# Patient Record
Sex: Female | Born: 1985 | Hispanic: No | Marital: Married | Smoking: Never smoker
Health system: Southern US, Community
[De-identification: ages and names within clinical notes are randomized; demographics above are authoritative.]

## PROBLEM LIST (undated history)

## (undated) DIAGNOSIS — G43909 Migraine, unspecified, not intractable, without status migrainosus: Secondary | ICD-10-CM

## (undated) DIAGNOSIS — Z9889 Other specified postprocedural states: Secondary | ICD-10-CM

## (undated) HISTORY — PX: TUBAL LIGATION: SHX77

## (undated) HISTORY — DX: Migraine, unspecified, not intractable, without status migrainosus: G43.909

## (undated) HISTORY — PX: IUD REMOVAL: SHX5392

## (undated) HISTORY — DX: Other specified postprocedural states: Z98.890

---

## 2012-03-29 ENCOUNTER — Other Ambulatory Visit (HOSPITAL_COMMUNITY)
Admission: RE | Admit: 2012-03-29 | Discharge: 2012-03-29 | Disposition: A | Discharge status: Home / Self Care | Payer: BC Managed Care – PPO | Source: Ambulatory Visit | Attending: Obstetrics and Gynecology | Admitting: Obstetrics and Gynecology

## 2012-03-29 DIAGNOSIS — Z01419 Encounter for gynecological examination (general) (routine) without abnormal findings: Secondary | ICD-10-CM | POA: Insufficient documentation

## 2012-08-03 ENCOUNTER — Ambulatory Visit: Payer: BC Managed Care – PPO | Admitting: Family Medicine

## 2012-08-03 VITALS — BP 98/62 | HR 70 | Temp 99.0°F | Resp 16 | Ht 64.0 in | Wt 122.0 lb

## 2012-08-03 DIAGNOSIS — J020 Streptococcal pharyngitis: Secondary | ICD-10-CM

## 2012-08-03 MED ORDER — AMOXICILLIN 875 MG PO TABS: 875.0000 mg | ORAL_TABLET | Freq: Two times a day (BID) | ORAL | Status: DC

## 2012-08-03 NOTE — Patient Instructions (Addendum)
Strep Throat  Strep throat is an infection of the throat caused by a bacteria named Streptococcus pyogenes. Your caregiver may call the infection streptococcal "tonsillitis" or "pharyngitis" depending on whether there are signs of inflammation in the tonsils or back of the throat. Strep throat is most common in children aged 29 15 years during the cold months of the year, but it can occur in people of any age during any season. This infection is spread from person to person (contagious) through coughing, sneezing, or other close contact.  SYMPTOMS   · Fever or chills.  · Painful, swollen, red tonsils or throat.  · Pain or difficulty when swallowing.  · White or yellow spots on the tonsils or throat.  · Swollen, tender lymph nodes or "glands" of the neck or under the jaw.  · Red rash all over the body (rare).  DIAGNOSIS   Many different infections can cause the same symptoms. A test must be done to confirm the diagnosis so the right treatment can be given. A "rapid strep test" can help your caregiver make the diagnosis in a few minutes. If this test is not available, a light swab of the infected area can be used for a throat culture test. If a throat culture test is done, results are usually available in a day or two.  TREATMENT   Strep throat is treated with antibiotic medicine.  HOME CARE INSTRUCTIONS   · Gargle with 1 tsp of salt in 1 cup of warm water, 3 4 times per day or as needed for comfort.  · Family members who also have a sore throat or fever should be tested for strep throat and treated with antibiotics if they have the strep infection.  · Make sure everyone in your household washes their hands well.  · Do not share food, drinking cups, or personal items that could cause the infection to spread to others.  · You may need to eat a soft food diet until your sore throat gets better.  · Drink enough water and fluids to keep your urine clear or pale yellow. This will help prevent dehydration.  · Get plenty of  rest.  · Stay home from school, daycare, or work until you have been on antibiotics for 24 hours.  · Only take over-the-counter or prescription medicines for pain, discomfort, or fever as directed by your caregiver.  · If antibiotics are prescribed, take them as directed. Finish them even if you start to feel better.  SEEK MEDICAL CARE IF:   · The glands in your neck continue to enlarge.  · You develop a rash, cough, or earache.  · You cough up green, yellow-brown, or bloody sputum.  · You have pain or discomfort not controlled by medicines.  · Your problems seem to be getting worse rather than better.  SEEK IMMEDIATE MEDICAL CARE IF:   · You develop any new symptoms such as vomiting, severe headache, stiff or painful neck, chest pain, shortness of breath, or trouble swallowing.  · You develop severe throat pain, drooling, or changes in your voice.  · You develop swelling of the neck, or the skin on the neck becomes red and tender.  · You have a fever.  · You develop signs of dehydration, such as fatigue, dry mouth, and decreased urination.  · You become increasingly sleepy, or you cannot wake up completely.  Document Released: 01/17/2000 Document Revised: 01/06/2012 Document Reviewed: 03/20/2010  [employer]® Patient Information ©2014 [employer].

## 2012-08-03 NOTE — Progress Notes (Signed)
Subjective: 27 year old lady who has had a sore throat for couple days. She was feverish yesterday, up to 102 months when she was getting out of bed. She has had a sore throat, generalized achiness and malaise, and tender swollen glands in her neck. Not coughing and does not have rhinorrhea. She does not smoke. Her sister had a sore throat last weekend and they've been together some.  Objective: Well-developed well-nourished young lady who doesn't look like she feels were well. Her TMs are normal. Throat erythematous without exudate. Neck has moderately large anterior cervical nodes. Chest is clear to auscultation. Heart regular without murmurs.  Assessment: Pharyngitis, suspicious for strep  Plan: Strep screen. If rapid test is negative will do full culture.  Results for orders placed in visit on 08/03/12  POCT RAPID STREP A (OFFICE)      Result Value Range   Rapid Strep A Screen Positive (*) Negative

## 2012-09-02 ENCOUNTER — Emergency Department (HOSPITAL_COMMUNITY)
Admission: EM | Admit: 2012-09-02 | Discharge: 2012-09-02 | Disposition: A | Discharge status: Home / Self Care | Payer: BC Managed Care – PPO | Attending: Emergency Medicine | Admitting: Emergency Medicine

## 2012-09-02 ENCOUNTER — Encounter (HOSPITAL_COMMUNITY): Payer: Self-pay | Admitting: Emergency Medicine

## 2012-09-02 ENCOUNTER — Emergency Department (HOSPITAL_COMMUNITY): Payer: BC Managed Care – PPO

## 2012-09-02 DIAGNOSIS — B9789 Other viral agents as the cause of diseases classified elsewhere: Secondary | ICD-10-CM | POA: Insufficient documentation

## 2012-09-02 DIAGNOSIS — IMO0001 Reserved for inherently not codable concepts without codable children: Secondary | ICD-10-CM | POA: Insufficient documentation

## 2012-09-02 DIAGNOSIS — R5381 Other malaise: Secondary | ICD-10-CM | POA: Insufficient documentation

## 2012-09-02 DIAGNOSIS — R599 Enlarged lymph nodes, unspecified: Secondary | ICD-10-CM | POA: Insufficient documentation

## 2012-09-02 DIAGNOSIS — R5383 Other fatigue: Secondary | ICD-10-CM | POA: Insufficient documentation

## 2012-09-02 DIAGNOSIS — R0982 Postnasal drip: Secondary | ICD-10-CM | POA: Insufficient documentation

## 2012-09-02 DIAGNOSIS — R110 Nausea: Secondary | ICD-10-CM | POA: Insufficient documentation

## 2012-09-02 DIAGNOSIS — J029 Acute pharyngitis, unspecified: Secondary | ICD-10-CM | POA: Insufficient documentation

## 2012-09-02 DIAGNOSIS — B349 Viral infection, unspecified: Secondary | ICD-10-CM

## 2012-09-02 DIAGNOSIS — J3489 Other specified disorders of nose and nasal sinuses: Secondary | ICD-10-CM | POA: Insufficient documentation

## 2012-09-02 LAB — CBC WITH DIFFERENTIAL/PLATELET
Basophils Absolute: 0 10*3/uL (ref 0.0–0.1)
HCT: 37.2 % (ref 36.0–46.0)
Hemoglobin: 12.3 g/dL (ref 12.0–15.0)
Lymphocytes Relative: 12 % (ref 12–46)
Monocytes Absolute: 1.4 10*3/uL — ABNORMAL HIGH (ref 0.1–1.0)
Neutro Abs: 9 10*3/uL — ABNORMAL HIGH (ref 1.7–7.7)
RDW: 14.2 % (ref 11.5–15.5)
WBC: 11.8 10*3/uL — ABNORMAL HIGH (ref 4.0–10.5)

## 2012-09-02 LAB — URINALYSIS, ROUTINE W REFLEX MICROSCOPIC
Glucose, UA: NEGATIVE mg/dL
Leukocytes, UA: NEGATIVE
Protein, ur: 30 mg/dL — AB
pH: 6 (ref 5.0–8.0)

## 2012-09-02 LAB — URINE MICROSCOPIC-ADD ON

## 2012-09-02 MED ORDER — ONDANSETRON HCL 4 MG PO TABS: 4.0000 mg | ORAL_TABLET | Freq: Four times a day (QID) | ORAL | Status: DC

## 2012-09-02 MED ORDER — ONDANSETRON 4 MG PO TBDP
4.0000 mg | ORAL_TABLET | Freq: Once | ORAL | Status: AC
  Administered 2012-09-02: 4 mg via ORAL
  Filled 2012-09-02: qty 1

## 2012-09-02 NOTE — ED Notes (Signed)
Pt c/o fever, sore throat and congestion since Monday night. Pt father stated that she has had a cold off and on all summer. Pt went to [facility] Wednesday and received ABT shot and steroid shot and was also started on Zpack. No relief and last night felt the worse that she has ever felt. Achy all over and unable to sleep d/t body aches. Pt stated that she was having a little bit of ear pain that started last night.

## 2012-09-02 NOTE — ED Notes (Signed)
Pt ambulated to the bathroom without any assistance

## 2012-09-02 NOTE — ED Provider Notes (Signed)
CSN: [id number]     Arrival date & time 09/02/12  1002 History     First MD Initiated Contact with Patient 09/02/12 1004     Chief Complaint  Patient presents with  . Fever  . Sore Throat  . Nasal Congestion    HPI  Fever since tue (3 days) up to 101.  PCP tue with lab (wbc 11K).  Had "strep 7/2 tx c amoxicillin (no rash), but ASO Wed negative.  Still with anorexia, myalgias, ST, post nasal drainage, no neck pain.  No UTI, no cough. Past Medical History  Diagnosis Date  . Hx of LASIK    Past Surgical History  Procedure Laterality Date  . Cesarean section    . Iud removal     Family History  Problem Relation Age of Onset  . Diabetes Mother    History  Substance Use Topics  . Smoking status: Never Smoker   . Smokeless tobacco: Not on file  . Alcohol Use: No   OB History   Grav Para Term Preterm Abortions TAB SAB Ect Mult Living                 Review of Systems  Constitutional: Positive for fever, chills and fatigue. Negative for diaphoresis and appetite change.  HENT: Positive for sore throat, rhinorrhea, postnasal drip and sinus pressure. Negative for mouth sores, trouble swallowing, neck pain and neck stiffness.   Eyes: Negative for visual disturbance.  Respiratory: Negative for cough, chest tightness, shortness of breath and wheezing.   Cardiovascular: Negative for chest pain.  Gastrointestinal: Positive for nausea. Negative for vomiting, abdominal pain, diarrhea and abdominal distention.  Endocrine: Negative for polydipsia, polyphagia and polyuria.  Genitourinary: Negative for dysuria, frequency and hematuria.  Musculoskeletal: Positive for myalgias. Negative for arthralgias and gait problem.  Skin: Negative for color change, pallor and rash.  Neurological: Negative for dizziness, syncope, light-headedness and headaches.  Hematological: Does not bruise/bleed easily.  Psychiatric/Behavioral: Negative for behavioral problems and confusion.    Allergies  Review  of patient's allergies indicates no known allergies.  Home Medications   Current Outpatient Rx  Name  Route  Sig  Dispense  Refill  . acetaminophen (TYLENOL) 500 MG tablet   Oral   Take 1,000 mg by mouth every 6 (six) hours as needed for pain.         Marland Kitchen azithromycin (ZITHROMAX Z-PAK) 250 MG tablet   Oral   Take 250 mg by mouth daily.         Marland Kitchen ibuprofen (ADVIL,MOTRIN) 200 MG tablet   Oral   Take 400 mg by mouth every 6 (six) hours as needed for pain.         Marland Kitchen ondansetron (ZOFRAN) 4 MG tablet   Oral   Take 1 tablet (4 mg total) by mouth every 6 (six) hours.   12 tablet   0    BP 102/67  Pulse 88  Temp(Src) 98.1 F (36.7 C) (Oral)  Resp 18  SpO2 100%  LMP 08/03/2012 Physical Exam  Constitutional: She is oriented to person, place, and time. She appears well-developed and well-nourished. No distress.  HENT:  Head: Normocephalic.  Mouth/Throat: Oropharyngeal exudate present.  Eyes: Conjunctivae are normal. Pupils are equal, round, and reactive to light. No scleral icterus.  Neck: Normal range of motion. Neck supple. No thyromegaly present.  Cardiovascular: Normal rate and regular rhythm.  Exam reveals no gallop and no friction rub.   No murmur heard. Pulmonary/Chest: Effort normal and  breath sounds normal. No respiratory distress. She has no wheezes. She has no rales.  Abdominal: Soft. Bowel sounds are normal. She exhibits no distension. There is no tenderness. There is no rebound.  Musculoskeletal: Normal range of motion.  Lymphadenopathy:    She has cervical adenopathy.  Neurological: She is alert and oriented to person, place, and time.  Skin: Skin is warm and dry. No rash noted.  Psychiatric: She has a normal mood and affect. Her behavior is normal.    ED Course   Procedures (including critical care time)  Labs Reviewed  URINALYSIS, ROUTINE W REFLEX MICROSCOPIC - Abnormal; Notable for the following:    Color, Urine AMBER (*)    APPearance CLOUDY (*)     Specific Gravity, Urine 1.043 (*)    Bilirubin Urine SMALL (*)    Ketones, ur 15 (*)    Protein, ur 30 (*)    All other components within normal limits  CBC WITH DIFFERENTIAL - Abnormal; Notable for the following:    WBC 11.8 (*)    Neutro Abs 9.0 (*)    Monocytes Absolute 1.4 (*)    All other components within normal limits  URINE MICROSCOPIC-ADD ON - Abnormal; Notable for the following:    Squamous Epithelial / LPF FEW (*)    All other components within normal limits  RAPID STREP SCREEN  URINE CULTURE  CULTURE, GROUP A STREP  MONONUCLEOSIS SCREEN   Dg Chest 2 View  09/02/2012   *RADIOLOGY REPORT*  Clinical Data: Fever, sore throat and nasal congestion.  CHEST - 2 VIEW  Comparison: None.  Findings: Lungs are adequately inflated without focal consolidation or effusion.  The cardiomediastinal silhouette is within normal. Remaining bones and soft tissues are unremarkable.  IMPRESSION: No acute cardiopulmonary disease.   Original Report Authenticated By: Elberta Fortis, M.D.   1. Viral syndrome     MDM  Neg strep.  Neg MONO.  No pneumonia.  Dx:  Viral syndrome.  Plan: expectant management, symptom tx, rest, fluid po hydration  Claudean Kinds, MD 09/02/12 1158

## 2012-09-02 NOTE — ED Notes (Signed)
father at bedside

## 2012-09-03 LAB — URINE CULTURE

## 2012-09-04 LAB — CULTURE, GROUP A STREP

## 2015-03-12 ENCOUNTER — Other Ambulatory Visit (HOSPITAL_COMMUNITY)
Admission: RE | Admit: 2015-03-12 | Discharge: 2015-03-12 | Disposition: A | Discharge status: Home / Self Care | Payer: BC Managed Care – PPO | Source: Ambulatory Visit | Attending: Nurse Practitioner | Admitting: Nurse Practitioner

## 2015-03-12 ENCOUNTER — Other Ambulatory Visit: Payer: Self-pay | Admitting: Nurse Practitioner

## 2015-03-12 DIAGNOSIS — Z01419 Encounter for gynecological examination (general) (routine) without abnormal findings: Secondary | ICD-10-CM | POA: Diagnosis present

## 2015-03-12 DIAGNOSIS — Z1151 Encounter for screening for human papillomavirus (HPV): Secondary | ICD-10-CM | POA: Insufficient documentation

## 2015-03-14 LAB — CYTOLOGY - PAP

## 2016-04-28 ENCOUNTER — Encounter: Payer: Self-pay | Admitting: Adult Health

## 2016-04-28 ENCOUNTER — Ambulatory Visit (INDEPENDENT_AMBULATORY_CARE_PROVIDER_SITE_OTHER): Payer: BLUE CROSS/BLUE SHIELD | Admitting: Adult Health

## 2016-04-28 VITALS — BP 100/61 | HR 70 | Temp 97.9°F | Ht 64.0 in | Wt 126.5 lb

## 2016-04-28 DIAGNOSIS — Z833 Family history of diabetes mellitus: Secondary | ICD-10-CM | POA: Diagnosis not present

## 2016-04-28 DIAGNOSIS — H938X1 Other specified disorders of right ear: Secondary | ICD-10-CM | POA: Diagnosis not present

## 2016-04-28 DIAGNOSIS — R5383 Other fatigue: Secondary | ICD-10-CM

## 2016-04-28 DIAGNOSIS — Z8249 Family history of ischemic heart disease and other diseases of the circulatory system: Secondary | ICD-10-CM | POA: Diagnosis not present

## 2016-04-28 DIAGNOSIS — Z0000 Encounter for general adult medical examination without abnormal findings: Secondary | ICD-10-CM | POA: Insufficient documentation

## 2016-04-28 DIAGNOSIS — Z87898 Personal history of other specified conditions: Secondary | ICD-10-CM

## 2016-04-28 MED ORDER — CIPROFLOXACIN-DEXAMETHASONE 0.3-0.1 % OT SUSP: 4.0000 [drp] | Freq: Two times a day (BID) | OTIC | 0 refills | Status: DC

## 2016-04-28 NOTE — Assessment & Plan Note (Signed)
  Please schedule fasting lab nurse visit at your convenience. Increase frequency of eating to reduce/stop episodes of low blood sugar. Ear drops per instructions. Please call clinic next wed (05/06/16) and let us know how your right ear is responding to medication. Annual follow-up, sooner if needed.

## 2016-04-28 NOTE — Progress Notes (Signed)
Subjective:    Patient ID: Jessica Sloan, female    DOB: 1985-06-04, 31 y.o.   MRN: [id number]  HPI:  Ms. Harr presents to establish as a new pt.  She is very pleasant, healthy 31 year old female.  PMH:  Hypoglycemia episodes, she estimates this occurs few times/month.  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  She reports not eating for long periods of time and she is "constantly walking, moving, lifting during the day".  She denies CP/dyspnea/palpitations.   She also reports right ear pressure that developed > 3 weeks ago.  She denies recent swimming or era/head trauma prior to onset of sx's.  She denies change in hearing.  Patient Care Team    Relationship Specialty Notifications Start End  Malon Kindle, NP PCP - General Family Medicine  04/28/16     Patient Active Problem List   Diagnosis Date Noted  . Health care maintenance 04/28/2016  . Pressure sensation in ear, right 04/28/2016     Past Medical History:  Diagnosis Date  . Hx of LASIK      Past Surgical History:  Procedure Laterality Date  . CESAREAN SECTION    . IUD REMOVAL       Family History  Problem Relation Age of Onset  . Diabetes Mother   . Hypertension Mother   . Alcohol abuse Father   . Hypertension Father      History  Drug Use No     History  Alcohol Use  . Yes    Comment: rarely     History  Smoking Status  . Never Smoker  Smokeless Tobacco  . Never Used     Outpatient Encounter Prescriptions as of 04/28/2016  Medication Sig Note  . acetaminophen (TYLENOL) 500 MG tablet Take 1,000 mg by mouth every 6 (six) hours as needed for pain.   Marland Kitchen ibuprofen (ADVIL,MOTRIN) 200 MG tablet Take 400 mg by mouth every 6 (six) hours as needed for pain.   . ciprofloxacin-dexamethasone (CIPRODEX) otic suspension Place 4 drops into the right ear 2 (two) times daily.   . [DISCONTINUED] azithromycin (ZITHROMAX Z-PAK) 250 MG tablet Take 250 mg by mouth daily.  09/02/2012: On day 3 of Z-Pak  . [DISCONTINUED] ondansetron (ZOFRAN) 4 MG tablet Take 1 tablet (4 mg total) by mouth every 6 (six) hours.    No facility-administered encounter medications on file as of 04/28/2016.     Allergies: Patient has no known allergies.  Body mass index is 21.71 kg/m.  Blood pressure 100/61, pulse 70, temperature 97.9 F (36.6 C), temperature source Oral, height 5\' 4"  (1.626 m), weight 126 lb 8 oz (57.4 kg), last menstrual period 04/01/2016.   Review of Systems  Constitutional: Negative for activity change, appetite change, chills, diaphoresis, fatigue, fever and unexpected weight change.  HENT: Negative for congestion, ear discharge, ear pain and postnasal drip.        Right ear pressure that developed 3 weeks ago.  Eyes: Negative for visual disturbance.  Respiratory: Negative for cough, chest tightness, shortness of breath, wheezing and stridor.   Cardiovascular: Negative for chest pain, palpitations and leg swelling.  Gastrointestinal: Negative for abdominal distention, abdominal pain, blood in stool, constipation, diarrhea, nausea and vomiting.  Endocrine: Negative for cold intolerance, heat intolerance, polydipsia, polyphagia and polyuria.  Genitourinary: Negative for difficulty urinating, flank pain and menstrual problem.  Musculoskeletal: Negative for arthralgias, back pain, gait problem, joint swelling  and myalgias.  Skin: Negative for color change, pallor, rash and wound.  Allergic/Immunologic: Negative for immunocompromised state.  Neurological: Positive for headaches. Negative for dizziness, tremors, weakness and light-headedness.  Hematological: Does not bruise/bleed easily.  Psychiatric/Behavioral: Negative for agitation, self-injury and sleep disturbance. The patient is not nervous/anxious.        Objective:   Physical Exam  Constitutional: She is oriented to person, place, and time. She appears well-developed and well-nourished. No distress.   HENT:  Head: Normocephalic and atraumatic.  Right Ear: Hearing normal. There is swelling. No drainage or tenderness. Tympanic membrane is bulging. Tympanic membrane is not erythematous. No decreased hearing is noted.  Left Ear: Hearing and external ear normal. No drainage, swelling or tenderness. Tympanic membrane is not erythematous and not bulging. No decreased hearing is noted.  Nose: Nose normal. No rhinorrhea. Right sinus exhibits no maxillary sinus tenderness and no frontal sinus tenderness. Left sinus exhibits no maxillary sinus tenderness and no frontal sinus tenderness.  Mouth/Throat: Uvula is midline.  Eyes: Conjunctivae are normal. Pupils are equal, round, and reactive to light.  Neck: Normal range of motion. Neck supple.  Cardiovascular: Normal rate, regular rhythm, normal heart sounds and intact distal pulses.   No murmur heard. Pulmonary/Chest: Effort normal and breath sounds normal. No respiratory distress. She has no wheezes. She has no rales. She exhibits no tenderness.  Abdominal: Soft. Bowel sounds are normal. She exhibits no distension and no mass. There is no tenderness. There is no rebound, no guarding and no CVA tenderness.  Hyperactive BS in all quadrants.  Musculoskeletal: Normal range of motion.  Lymphadenopathy:    She has no cervical adenopathy.  Neurological: She is alert and oriented to person, place, and time.  Skin: Skin is warm and dry. No rash noted. She is not diaphoretic. No erythema. No pallor.  Psychiatric: She has a normal mood and affect. Her behavior is normal. Judgment and thought content normal.  Nursing note and vitals reviewed.         Assessment & Plan:   1. Family history of diabetes mellitus   2. Other fatigue   3. History of syncope   4. Family history of hypertension   5. Health care maintenance   6. Pressure sensation in ear, right     Health care maintenance  Please schedule fasting lab nurse visit at your  convenience. Increase frequency of eating to reduce/stop episodes of low blood sugar. Ear drops per instructions. Please call clinic next wed (05/06/16) and let us know how your right ear is responding to medication. Annual follow-up, sooner if needed.  Pressure sensation in ear, right Right ear flushed in clinic. Ear drops per instructions. Please call clinic next wed (05/06/16) and let us know how your right ear is responding to medication.     FOLLOW-UP:  Return in about 1 year (around 04/28/2017) for Regular Follow Up.

## 2016-04-28 NOTE — Patient Instructions (Addendum)
Hypoglycemia Hypoglycemia occurs when the level of sugar (glucose) in the blood is too low. Glucose is a type of sugar that provides the body's main source of energy. Certain hormones (insulin and glucagon) control the level of glucose in the blood. Insulin lowers blood glucose, and glucagon increases blood glucose. Hypoglycemia can result from having too much insulin in the bloodstream, or from not eating enough food that contains glucose. Hypoglycemia can happen in people who do or do not have diabetes. It can develop quickly, and it can be a medical emergency. What are the causes? Hypoglycemia occurs most often in people who have diabetes. If you have diabetes, hypoglycemia may be caused by:  Diabetes medicine.  Not eating enough, or not eating often enough.  Increased physical activity.  Drinking alcohol, especially when you have not eaten recently. If you do not have diabetes, hypoglycemia may be caused by:  A tumor in the pancreas. The pancreas is the organ that makes insulin.  Not eating enough, or not eating for long periods at a time (fasting).  Severe infection or illness that affects the liver, heart, or kidneys.  Certain medicines. You may also have reactive hypoglycemia. This condition causes hypoglycemia within 4 hours of eating a meal. This may occur after having stomach surgery. Sometimes, the cause of reactive hypoglycemia is not known. What increases the risk? Hypoglycemia is more likely to develop in:  People who have diabetes and take medicines to lower blood glucose.  People who abuse alcohol.  People who have a severe illness. What are the signs or symptoms? Hypoglycemia may not cause any symptoms. If you have symptoms, they may include:  Hunger.  Anxiety.  Sweating and feeling clammy.  Confusion.  Dizziness or feeling light-headed.  Sleepiness.  Nausea.  Increased heart rate.  Headache.  Blurry vision.  Seizure.  Nightmares.  Tingling  or numbness around the mouth, lips, or tongue.  A change in speech.  Decreased ability to concentrate.  A change in coordination.  Restless sleep.  Tremors or shakes.  Fainting.  Irritability. How is this diagnosed? Hypoglycemia is diagnosed with a blood test to measure your blood glucose level. This blood test is done while you are having symptoms. Your health care provider may also do a physical exam and review your medical history. If you do not have diabetes, other tests may be done to find the cause of your hypoglycemia. How is this treated? This condition can often be treated by immediately eating or drinking something that contains glucose, such as:  3-4 sugar tablets (glucose pills).  Glucose gel, 15-gram tube.  Fruit juice, 4 oz (120 mL).  Regular soda (not diet soda), 4 oz (120 mL).  Low-fat milk, 4 oz (120 mL).  Several pieces of hard candy.  Sugar or honey, 1 Tbsp. Treating Hypoglycemia If You Have Diabetes  If you are alert and able to swallow safely, follow the 15:15 rule:  Take 15 grams of a rapid-acting carbohydrate. Rapid-acting options include:  1 tube of glucose gel.  3 glucose pills.  6-8 pieces of hard candy.  4 oz (120 mL) of fruit juice.  4 oz (120 ml) of regular (not diet) soda.  Check your blood glucose 15 minutes after you take the carbohydrate.  If the repeat blood glucose level is still at or below 70 mg/dL (3.9 mmol/L), take 15 grams of a carbohydrate again.  If your blood glucose level does not increase above 70 mg/dL (3.9 mmol/L) after 3 tries, seek emergency   emergency medical care.  After your blood glucose level returns to normal, eat a meal or a snack within 1 hour. Treating Severe Hypoglycemia  Severe hypoglycemia is when your blood glucose level is at or below 54 mg/dL (3 mmol/L). Severe hypoglycemia is an emergency. Do not wait to see if the symptoms will go away. Get medical help right away. Call your local emergency services (911  in the [place] Do not drive yourself to the hospital. If you have severe hypoglycemia and you cannot eat or drink, you may need an injection of glucagon. A family member or close friend should learn how to check your blood glucose and how to give you a glucagon injection. Ask your health care provider if you need to have an emergency glucagon injection kit available. Severe hypoglycemia may need to be treated in a hospital. The treatment may include getting glucose through an IV tube. You may also need treatment for the cause of your hypoglycemia. Follow these instructions at home: General instructions   Avoid any diets that cause you to not eat enough food. Talk with your health care provider before you start any new diet.  Take over-the-counter and prescription medicines only as told by your health care provider.  Limit alcohol intake to no more than 1 drink per day for nonpregnant women and 2 drinks per day for men. One drink equals 12 oz of beer, 5 oz of wine, or 1 oz of hard liquor.  Keep all follow-up visits as told by your health care provider. This is important. If You Have Diabetes:    Make sure you know the symptoms of hypoglycemia.  Always have a rapid-acting carbohydrate snack with you to treat low blood sugar.  Follow your diabetes management plan, as told by your health care provider. Make sure you:  Take your medicines as directed.  Follow your exercise plan.  Follow your meal plan. Eat on time, and do not skip meals.  Check your blood glucose as often as directed. Make sure to check your blood glucose before and after exercise. If you exercise longer or in a different way than usual, check your blood glucose more often.  Follow your sick day plan whenever you cannot eat or drink normally. Make this plan in advance with your health care provider.  Share your diabetes management plan with people in your workplace, school, and household.  Check your urine for ketones  when you are ill and as told by your health care provider.  Carry a medical alert card or wear medical alert jewelry. If You Have Reactive Hypoglycemia or Low Blood Sugar From Other Causes:   Monitor your blood glucose as told by your health care provider.  Follow instructions from your health care provider about eating or drinking restrictions. Contact a health care provider if:  You have problems keeping your blood glucose in your target range.  You have frequent episodes of hypoglycemia. Get help right away if:  You continue to have hypoglycemia symptoms after eating or drinking something containing glucose.  Your blood glucose is at or below 54 mg/dL (3 mmol/L).  You have a seizure.  You faint. These symptoms may represent a serious problem that is an emergency. Do not wait to see if the symptoms will go away. Get medical help right away. Call your local emergency services (911 in the [place] Do not drive yourself to the hospital. This information is not intended to replace advice given to you by your health care provider.  Make sure you discuss any questions you have with your health care provider. Document Released: 01/19/2005 Document Revised: 07/03/2015 Document Reviewed: 02/22/2015 Elsevier Interactive Patient Education  2017 [place].  Please schedule fasting lab nurse visit at your convenience. Increase frequency of eating to reduce/stop episodes of low blood sugar. Ear drops per instructions. Please call clinic next wed (05/06/16) and let us know how your right ear is responding to medication. Annual follow-up, sooner if needed.

## 2016-04-28 NOTE — Assessment & Plan Note (Signed)
Right ear flushed in clinic. Ear drops per instructions. Please call clinic next wed (05/06/16) and let us know how your right ear is responding to medication.

## 2017-03-01 ENCOUNTER — Encounter: Payer: Self-pay | Admitting: Adult Health

## 2017-03-01 ENCOUNTER — Ambulatory Visit (INDEPENDENT_AMBULATORY_CARE_PROVIDER_SITE_OTHER): Payer: 59 | Admitting: Adult Health

## 2017-03-01 ENCOUNTER — Ambulatory Visit: Payer: BC Managed Care – PPO | Admitting: Family Medicine

## 2017-03-01 VITALS — BP 95/62 | HR 70 | Temp 98.2°F | Ht 64.0 in | Wt 129.2 lb

## 2017-03-01 DIAGNOSIS — R1011 Right upper quadrant pain: Secondary | ICD-10-CM | POA: Diagnosis not present

## 2017-03-01 LAB — POCT URINALYSIS DIPSTICK
Bilirubin, UA: NEGATIVE
Glucose, UA: NEGATIVE
Ketones, UA: NEGATIVE
LEUKOCYTES UA: NEGATIVE
NITRITE UA: NEGATIVE
PROTEIN UA: NEGATIVE
RBC UA: NEGATIVE
SPEC GRAV UA: 1.02 (ref 1.010–1.025)
Urobilinogen, UA: 0.2 E.U./dL
pH, UA: 7 (ref 5.0–8.0)

## 2017-03-01 NOTE — Patient Instructions (Signed)
Abdominal Pain, Adult Abdominal pain can be caused by many things. Often, abdominal pain is not serious and it gets better with no treatment or by being treated at home. However, sometimes abdominal pain is serious. Your health care provider will do a medical history and a physical exam to try to determine the cause of your abdominal pain. Follow these instructions at home:  Take over-the-counter and prescription medicines only as told by your health care provider. Do not take a laxative unless told by your health care provider.  Drink enough fluid to keep your urine clear or pale yellow.  Watch your condition for any changes.  Keep all follow-up visits as told by your health care provider. This is important. Contact a health care provider if:  Your abdominal pain changes or gets worse.  You are not hungry or you lose weight without trying.  You are constipated or have diarrhea for more than 2-3 days.  You have pain when you urinate or have a bowel movement.  Your abdominal pain wakes you up at night.  Your pain gets worse with meals, after eating, or with certain foods.  You are throwing up and cannot keep anything down.  You have a fever. Get help right away if:  Your pain does not go away as soon as your health care provider told you to expect.  You cannot stop throwing up.  Your pain is only in areas of the abdomen, such as the right side or the left lower portion of the abdomen.  You have bloody or black stools, or stools that look like tar.  You have severe pain, cramping, or bloating in your abdomen.  You have signs of dehydration, such as: ? Dark urine, very little urine, or no urine. ? Cracked lips. ? Dry mouth. ? Sunken eyes. ? Sleepiness. ? Weakness. This information is not intended to replace advice given to you by your health care provider. Make sure you discuss any questions you have with your health care provider. Document Released: 10/29/2004 Document  Revised: 08/09/2015 Document Reviewed: 07/03/2015 Elsevier Interactive Patient Education  [employer]2018 [employer]  We will call you when labs and Ultrasound results are available. Please call us immediately if  you experience severe abdominal pain, fever, nausea, vomiting, or blood in urine/stool. If pain persists for another 1-2 weeks, please call us and we will refer you to GI Specialist. Please keep your OB/GYN appt. Please follow-up with us for annual physical with fasting labs in the next few months. FEEL BETTER!

## 2017-03-01 NOTE — Progress Notes (Signed)
Subjective:    Patient ID: Jessica Sloan, female    DOB: Feb 04, 1985, 32 y.o.   MRN: [id number]  HPI:  Ms. Jessica EpleyGarland presents with RUQ abdominal pain began 2-3 weeks ago.  Pain is rated 1/10 at rest and will increase to 6/10 with BM, pain described as "poking".  She denies fever/night sweats/poor appetite/blood un urine/stool.   She denies chance of pregnancy- tubal ligation 2012 She denies N/V/D, and reports normal BM- several a day.  Patient Care Team    Relationship Specialty Notifications Start End  Julaine Fusianford, Katy D, NP PCP - General Family Medicine  04/28/16     Patient Active Problem List   Diagnosis Date Noted  . Right upper quadrant abdominal pain 03/01/2017  . Health care maintenance 04/28/2016  . Pressure sensation in ear, right 04/28/2016     Past Medical History:  Diagnosis Date  . Hx of LASIK      Past Surgical History:  Procedure Laterality Date  . CESAREAN SECTION    . IUD REMOVAL       Family History  Problem Relation Age of Onset  . Diabetes Mother   . Hypertension Mother   . Alcohol abuse Father   . Hypertension Father      Social History   Substance and Sexual Activity  Drug Use No     Social History   Substance and Sexual Activity  Alcohol Use Yes   Comment: rarely     Social History   Tobacco Use  Smoking Status Never Smoker  Smokeless Tobacco Never Used     Outpatient Encounter Medications as of 03/01/2017  Medication Sig  . acetaminophen (TYLENOL) 500 MG tablet Take 1,000 mg by mouth every 6 (six) hours as needed for pain.  Marland Kitchen. ibuprofen (ADVIL,MOTRIN) 200 MG tablet Take 400 mg by mouth every 6 (six) hours as needed for pain.  . [DISCONTINUED] ciprofloxacin-dexamethasone (CIPRODEX) otic suspension Place 4 drops into the right ear 2 (two) times daily.   No facility-administered encounter medications on file as of 03/01/2017.     Allergies: Patient has no known allergies.  Body mass index is 22.18 kg/m.  Blood pressure  95/62, pulse 70, temperature 98.2 F (36.8 C), temperature source Oral, height 5\' 4"  (1.626 m), weight 129 lb 3.2 oz (58.6 kg), last menstrual period 02/23/2017, SpO2 98 %.    Review of Systems  Constitutional: Positive for fatigue. Negative for activity change, appetite change, chills, diaphoresis, fever and unexpected weight change.  Respiratory: Negative for cough, chest tightness, shortness of breath, wheezing and stridor.   Cardiovascular: Negative for chest pain, palpitations and leg swelling.  Gastrointestinal: Positive for abdominal pain. Negative for abdominal distention, blood in stool, constipation, diarrhea, nausea and vomiting.  Endocrine: Negative for cold intolerance, heat intolerance, polydipsia and polyphagia.  Genitourinary: Negative for difficulty urinating and flank pain.  Neurological: Negative for dizziness and headaches.  Hematological: Does not bruise/bleed easily.       Objective:   Physical Exam  Constitutional: She appears well-developed and well-nourished. No distress.  Eyes: Conjunctivae are normal. Pupils are equal, round, and reactive to light.  Cardiovascular: Normal rate, regular rhythm, normal heart sounds and intact distal pulses.  No murmur heard. Pulmonary/Chest: Effort normal and breath sounds normal. No respiratory distress. She has no wheezes. She has no rales. She exhibits no tenderness.  Abdominal: Soft. She exhibits no distension, no ascites and no mass. There is no hepatosplenomegaly. There is tenderness in the right upper quadrant. There is no rigidity,  no rebound, no guarding, no CVA tenderness, no tenderness at McBurney's point and negative Murphy's sign.  Skin: She is not diaphoretic.  Vitals reviewed.         Assessment & Plan:   1. Right upper quadrant abdominal pain     Right upper quadrant abdominal pain CBC, CMP drawn US Abdominal US order placed We will call you when labs and Ultrasound results are available. Please call  us immediately if  you experience severe abdominal pain, fever, nausea, vomiting, or blood in urine/stool. If pain persists for another 1-2 weeks, please call us and we will refer you to GI Specialist. Please keep your OB/GYN appt. Please follow-up with [place] for annual physical with fasting labs in the next few months.    FOLLOW-UP:  Return if symptoms worsen or fail to improve.

## 2017-03-01 NOTE — Assessment & Plan Note (Signed)
CBC, CMP drawn US Abdominal US order placed We will call you when labs and Ultrasound results are available. Please call us immediately if  you experience severe abdominal pain, fever, nausea, vomiting, or blood in urine/stool. If pain persists for another 1-2 weeks, please call us and we will refer you to GI Specialist. Please keep your OB/GYN appt. Please follow-up with us for annual physical with fasting labs in the next few months.

## 2017-03-02 ENCOUNTER — Other Ambulatory Visit: Payer: Self-pay | Admitting: Adult Health

## 2017-03-02 ENCOUNTER — Ambulatory Visit
Admission: RE | Admit: 2017-03-02 | Discharge: 2017-03-02 | Disposition: A | Discharge status: Home / Self Care | Payer: 59 | Source: Ambulatory Visit | Attending: Adult Health | Admitting: Adult Health

## 2017-03-02 DIAGNOSIS — K824 Cholesterolosis of gallbladder: Secondary | ICD-10-CM

## 2017-03-02 DIAGNOSIS — R1011 Right upper quadrant pain: Secondary | ICD-10-CM

## 2017-03-02 LAB — CBC WITH DIFFERENTIAL/PLATELET
Basophils Absolute: 0 10*3/uL (ref 0.0–0.2)
Basos: 0 %
EOS (ABSOLUTE): 0.1 10*3/uL (ref 0.0–0.4)
EOS: 2 %
HEMOGLOBIN: 12.9 g/dL (ref 11.1–15.9)
Hematocrit: 39 % (ref 34.0–46.6)
IMMATURE GRANS (ABS): 0 10*3/uL (ref 0.0–0.1)
IMMATURE GRANULOCYTES: 0 %
LYMPHS: 33 %
Lymphocytes Absolute: 1.9 10*3/uL (ref 0.7–3.1)
MCH: 28.2 pg (ref 26.6–33.0)
MCHC: 33.1 g/dL (ref 31.5–35.7)
MCV: 85 fL (ref 79–97)
MONOCYTES: 8 %
MONOS ABS: 0.5 10*3/uL (ref 0.1–0.9)
Neutrophils Absolute: 3.4 10*3/uL (ref 1.4–7.0)
Neutrophils: 57 %
Platelets: 381 10*3/uL — ABNORMAL HIGH (ref 150–379)
RBC: 4.58 x10E6/uL (ref 3.77–5.28)
RDW: 13.2 % (ref 12.3–15.4)
WBC: 5.9 10*3/uL (ref 3.4–10.8)

## 2017-03-02 LAB — COMPREHENSIVE METABOLIC PANEL
ALBUMIN: 4.5 g/dL (ref 3.5–5.5)
ALT: 11 IU/L (ref 0–32)
AST: 14 IU/L (ref 0–40)
Albumin/Globulin Ratio: 1.8 (ref 1.2–2.2)
Alkaline Phosphatase: 56 IU/L (ref 39–117)
BUN/Creatinine Ratio: 17 (ref 9–23)
BUN: 12 mg/dL (ref 6–20)
Bilirubin Total: 0.3 mg/dL (ref 0.0–1.2)
CALCIUM: 9.3 mg/dL (ref 8.7–10.2)
CO2: 26 mmol/L (ref 20–29)
CREATININE: 0.7 mg/dL (ref 0.57–1.00)
Chloride: 103 mmol/L (ref 96–106)
GFR, EST AFRICAN AMERICAN: 132 mL/min/{1.73_m2} (ref >59)
GFR, EST NON AFRICAN AMERICAN: 114 mL/min/{1.73_m2} (ref >59)
Globulin, Total: 2.5 g/dL (ref 1.5–4.5)
Glucose: 94 mg/dL (ref 65–99)
Potassium: 4.3 mmol/L (ref 3.5–5.2)
SODIUM: 141 mmol/L (ref 134–144)
TOTAL PROTEIN: 7 g/dL (ref 6.0–8.5)

## 2017-03-03 ENCOUNTER — Encounter: Payer: Self-pay | Admitting: Gastroenterology

## 2017-03-18 ENCOUNTER — Other Ambulatory Visit (INDEPENDENT_AMBULATORY_CARE_PROVIDER_SITE_OTHER): Payer: 59

## 2017-03-18 DIAGNOSIS — Z833 Family history of diabetes mellitus: Secondary | ICD-10-CM

## 2017-03-18 DIAGNOSIS — Z8249 Family history of ischemic heart disease and other diseases of the circulatory system: Secondary | ICD-10-CM

## 2017-03-18 DIAGNOSIS — Z87898 Personal history of other specified conditions: Secondary | ICD-10-CM

## 2017-03-18 DIAGNOSIS — R5383 Other fatigue: Secondary | ICD-10-CM

## 2017-03-19 LAB — LIPID PANEL
CHOL/HDL RATIO: 1.4 ratio (ref 0.0–4.4)
CHOLESTEROL TOTAL: 123 mg/dL (ref 100–199)
HDL: 85 mg/dL (ref >39)
LDL Calculated: 25 mg/dL (ref 0–99)
TRIGLYCERIDES: 63 mg/dL (ref 0–149)
VLDL Cholesterol Cal: 13 mg/dL (ref 5–40)

## 2017-03-19 LAB — VITAMIN D 25 HYDROXY (VIT D DEFICIENCY, FRACTURES): Vit D, 25-Hydroxy: 27.4 ng/mL — ABNORMAL LOW (ref 30.0–100.0)

## 2017-03-19 LAB — HEMOGLOBIN A1C
Est. average glucose Bld gHb Est-mCnc: 105 mg/dL
Hgb A1c MFr Bld: 5.3 % (ref 4.8–5.6)

## 2017-03-19 LAB — TSH: TSH: 1.54 u[IU]/mL (ref 0.450–4.500)

## 2017-03-21 ENCOUNTER — Other Ambulatory Visit: Payer: Self-pay | Admitting: Adult Health

## 2017-03-21 DIAGNOSIS — E559 Vitamin D deficiency, unspecified: Secondary | ICD-10-CM

## 2017-03-21 MED ORDER — VITAMIN D (ERGOCALCIFEROL) 1.25 MG (50000 UNIT) PO CAPS: 50000.0000 [IU] | ORAL_CAPSULE | ORAL | 0 refills | Status: AC

## 2017-04-12 ENCOUNTER — Ambulatory Visit (INDEPENDENT_AMBULATORY_CARE_PROVIDER_SITE_OTHER): Payer: 59 | Admitting: Gastroenterology

## 2017-04-12 ENCOUNTER — Other Ambulatory Visit (INDEPENDENT_AMBULATORY_CARE_PROVIDER_SITE_OTHER): Payer: 59

## 2017-04-12 ENCOUNTER — Encounter: Payer: Self-pay | Admitting: Gastroenterology

## 2017-04-12 VITALS — BP 102/64 | HR 68 | Ht 64.0 in | Wt 127.2 lb

## 2017-04-12 DIAGNOSIS — R1011 Right upper quadrant pain: Secondary | ICD-10-CM

## 2017-04-12 DIAGNOSIS — R1013 Epigastric pain: Secondary | ICD-10-CM

## 2017-04-12 DIAGNOSIS — G8929 Other chronic pain: Secondary | ICD-10-CM | POA: Diagnosis not present

## 2017-04-12 LAB — H. PYLORI ANTIBODY, IGG: H PYLORI IGG: NEGATIVE

## 2017-04-12 NOTE — Patient Instructions (Signed)
If you are age 765 or older, your body mass index should be between 23-30. Your Body mass index is 21.83 kg/m. If this is out of the aforementioned range listed, please consider follow up with your Primary Care Provider.  If you are age 34 or younger, your body mass index should be between 19-25. Your Body mass index is 21.83 kg/m. If this is out of the aformentioned range listed, please consider follow up with your Primary Care Provider.   Please call in 2-3 weeks with an update. [phone] ask for Raynelle FanningJulie   Your physician has requested that you go to the basement for lab work before leaving today.  Thank you for choosing [facility]  Dr Amada JupiterHenry Danis III

## 2017-04-12 NOTE — Progress Notes (Signed)
[facility] Consult Note:  History: Jessica Sloan 04/12/2017  Referring physician: Julaine Fusianford, Katy D, NP  Reason for consult/chief complaint: RUQ pain (on and off for 3-4 weeks) and Abdominal Pain (Epi Pain/sometimes radiating to R shoulder)   Subjective  HPI:  This is a 32 year old woman referred to see us for right upper quadrant pain.  It has occurred intermittently over the last several weeks.  It is a vague discomfort she has difficulty characterizing.  It does not necessarily occur with meals.  There has been some pain in the upper back but not necessarily at the same time as the right upper quadrant pain.  She has had a few episodes of epigastric pain also nonradiating with no clear triggers or relieving factors.  She denies nausea, vomiting, dysphagia, anorexia or weight loss.  Bowel habits are regular for her, twice a day, with no recent change in that.  No rectal bleeding.   ROS:  Review of Systems  Constitutional: Negative for appetite change and unexpected weight change.  HENT: Negative for mouth sores and voice change.   Eyes: Negative for pain and redness.  Respiratory: Negative for cough and shortness of breath.   Cardiovascular: Negative for chest pain and palpitations.  Genitourinary: Negative for dysuria and hematuria.  Musculoskeletal: Negative for arthralgias and myalgias.  Skin: Negative for pallor and rash.  Neurological: Negative for weakness and headaches.  Hematological: Negative for adenopathy.     Past Medical History: Past Medical History:  Diagnosis Date  . Hx of LASIK      Past Surgical History: Past Surgical History:  Procedure Laterality Date  . CESAREAN SECTION    . IUD REMOVAL       Family History: Family History  Problem Relation Age of Onset  . Hypertension Mother   . Diabetes type II Mother   . Alcohol abuse Father   . Hypertension Father     Social History: Social History   Socioeconomic History  .  Marital status: Married    Spouse name: None  . Number of children: None  . Years of education: None  . Highest education level: None  Social Needs  . Financial resource strain: None  . Food insecurity - worry: None  . Food insecurity - inability: None  . Transportation needs - medical: None  . Transportation needs - non-medical: None  Occupational History  . None  Tobacco Use  . Smoking status: Never Smoker  . Smokeless tobacco: Never Used  Substance and Sexual Activity  . Alcohol use: Yes    Comment: rarely  . Drug use: No  . Sexual activity: Yes  Other Topics Concern  . None  Social History Narrative  . None    Allergies: No Known Allergies  Outpatient Meds: Current Outpatient Medications  Medication Sig Dispense Refill  . acetaminophen (TYLENOL) 500 MG tablet Take 1,000 mg by mouth every 6 (six) hours as needed for pain.    Marland Kitchen. ibuprofen (ADVIL,MOTRIN) 200 MG tablet Take 400 mg by mouth every 6 (six) hours as needed for pain.    . Vitamin D, Ergocalciferol, (DRISDOL) 50000 units CAPS capsule Take 1 capsule (50,000 Units total) by mouth every 7 (seven) days. 16 capsule 0   No current facility-administered medications for this visit.       ___________________________________________________________________ Objective   Exam:  BP 102/64   Pulse 68   Ht 5\' 4"  (1.626 m)   Wt 127 lb 3.2 oz (57.7 kg)   BMI 21.83 kg/m  General: this is a(n) well-appearing woman  Eyes: sclera anicteric, no redness  ENT: oral mucosa moist without lesions, no cervical or supraclavicular lymphadenopathy, good dentition  CV: RRR without murmur, S1/S2, no JVD, no peripheral edema  Resp: clear to auscultation bilaterally, normal RR and effort noted  GI: soft, mild epigastric tenderness, with active bowel sounds. No guarding or palpable organomegaly noted.  Skin; warm and dry, no rash or jaundice noted  Neuro: awake, alert and oriented x 3. Normal gross motor function and  fluent speech  Labs:  CMP Latest Ref Rng & Units 03/01/2017  Glucose 65 - 99 mg/dL 94  BUN 6 - 20 mg/dL 12  Creatinine 1.61 - 0.96 mg/dL 0.45  Sodium 409 - 811 mmol/L 141  Potassium 3.5 - 5.2 mmol/L 4.3  Chloride 96 - 106 mmol/L 103  CO2 20 - 29 mmol/L 26  Calcium 8.7 - 10.2 mg/dL 9.3  Total Protein 6.0 - 8.5 g/dL 7.0  Total Bilirubin 0.0 - 1.2 mg/dL 0.3  Alkaline Phos 39 - 117 IU/L 56  AST 0 - 40 IU/L 14  ALT 0 - 32 IU/L 11   CBC Latest Ref Rng & Units 03/01/2017 09/02/2012  WBC 3.4 - 10.8 x10E3/uL 5.9 11.8(H)  Hemoglobin 11.1 - 15.9 g/dL 91.4 78.2  Hematocrit 95.6 - 46.6 % 39.0 37.2  Platelets 150 - 379 x10E3/uL 381(H) 210     Radiologic Studies:  Abdominal ultrasound 03/02/2017 gallbladder with 2 mm polyp, no stones.  Normal CBD diameter  Assessment: Encounter Diagnoses  Name Primary?  . Abdominal pain, chronic, epigastric Yes  . RUQ pain     This pain is difficult to characterize and not typical for biliary colic.  It has been going on several weeks, I am interested to know how it behaves over the next few weeks.  I think it would be premature to do CCK HIDA scan, especially since the results could be misleading with symptoms that are not typical for biliary colic.  There is also epigastric pain, so I recommended H. pylori antibody.  She is agreeable to that.  If that is negative and symptoms continue over the next few weeks, then she will contact me and schedule an upper endoscopy.   Thank you for the courtesy of this consult.  Please call me with any questions or concerns.  Charlie Pitter III  CC: Julaine Fusi, NP

## 2018-03-17 ENCOUNTER — Other Ambulatory Visit (HOSPITAL_COMMUNITY)
Admission: RE | Admit: 2018-03-17 | Discharge: 2018-03-17 | Disposition: A | Discharge status: Home / Self Care | Payer: 59 | Source: Ambulatory Visit | Attending: Nurse Practitioner | Admitting: Nurse Practitioner

## 2018-03-17 ENCOUNTER — Other Ambulatory Visit: Payer: Self-pay | Admitting: Nurse Practitioner

## 2018-03-17 DIAGNOSIS — Z01419 Encounter for gynecological examination (general) (routine) without abnormal findings: Secondary | ICD-10-CM | POA: Insufficient documentation

## 2018-03-21 LAB — CYTOLOGY - PAP
DIAGNOSIS: NEGATIVE
HPV (WINDOPATH): NOT DETECTED

## 2019-08-13 IMAGING — US US ABDOMEN COMPLETE
1 series · 14 of 25 positions shown · non-contrast
Comparison: No recent prior.

CLINICAL DATA: Right upper quadrant pain.

EXAM:
ABDOMEN ULTRASOUND COMPLETE

[Series 1: us abdomen complete · 0.17mm/px · 14 of 84 slices shown]
[im 1/84]
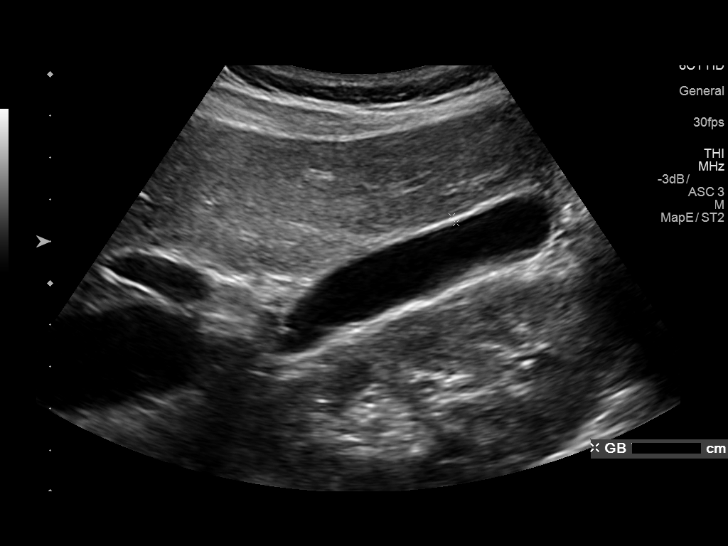
[im 7/84]
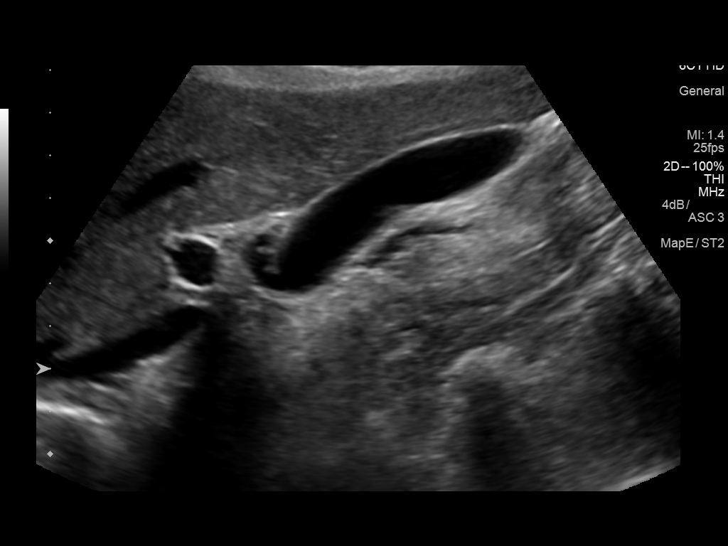
[im 14/84]
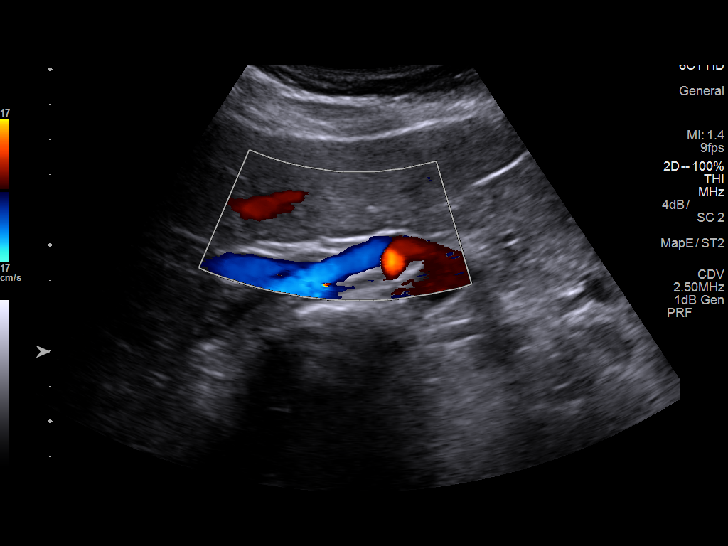
[im 21/84]
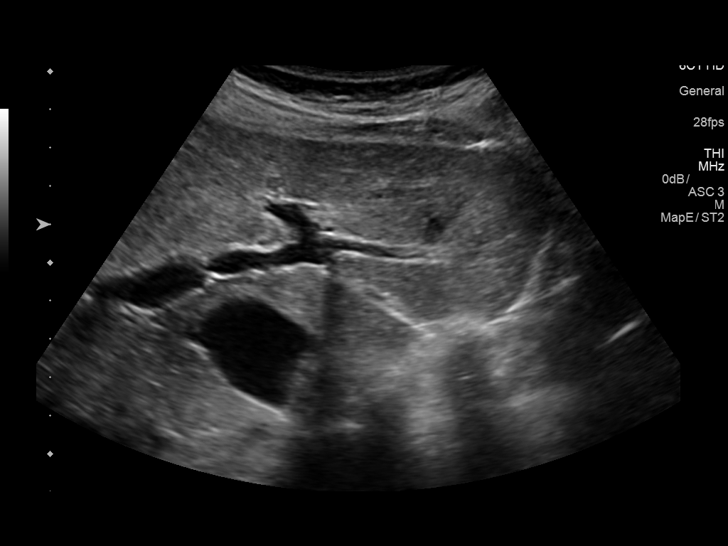
[im 28/84]
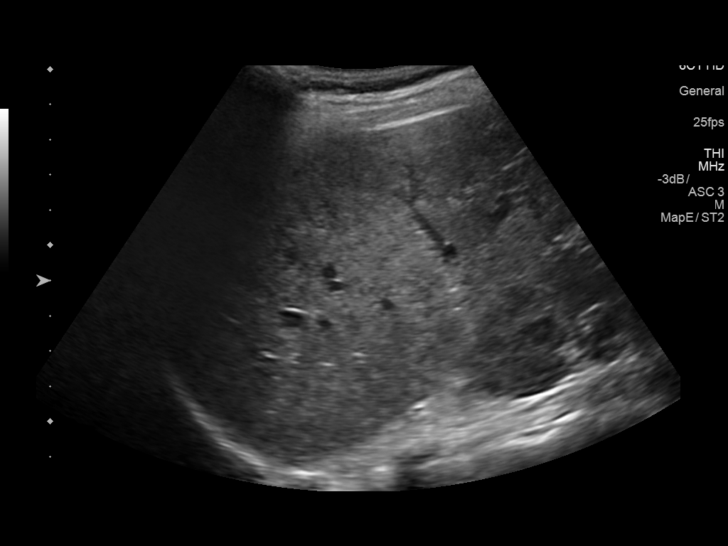
[im 32/84]
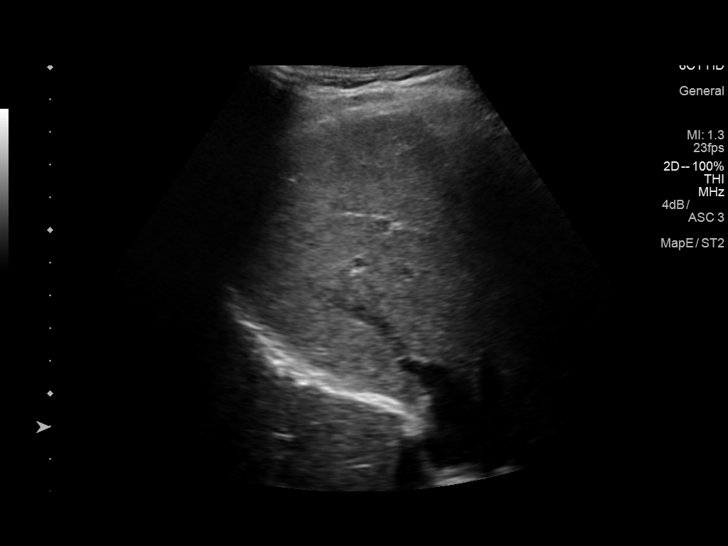
[im 39/84]
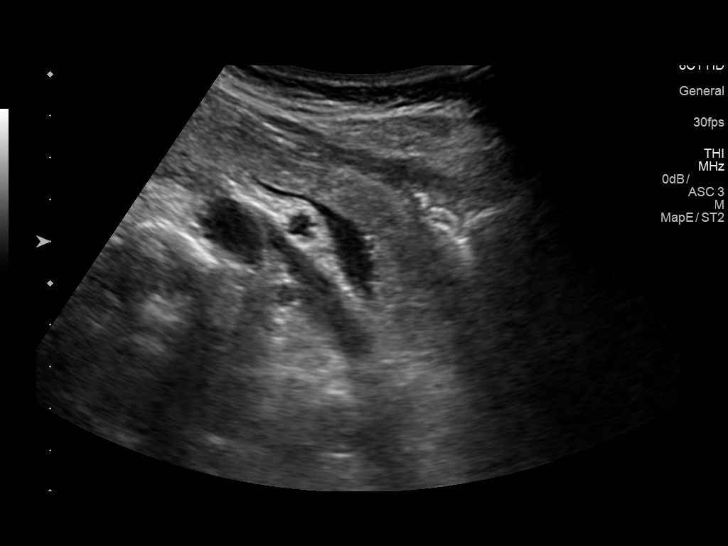
[im 45/84]
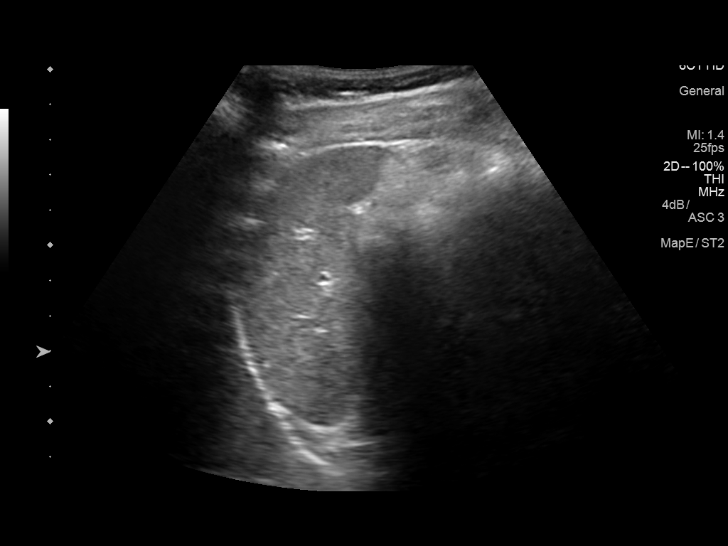
[im 52/84]
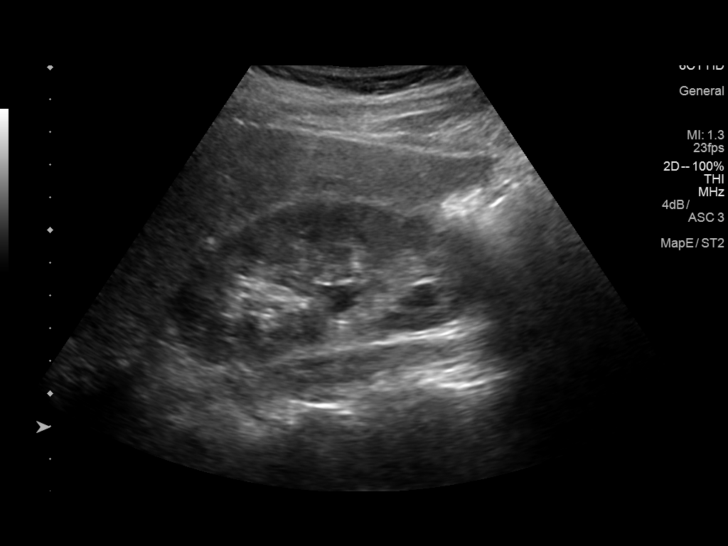
[im 56/84]
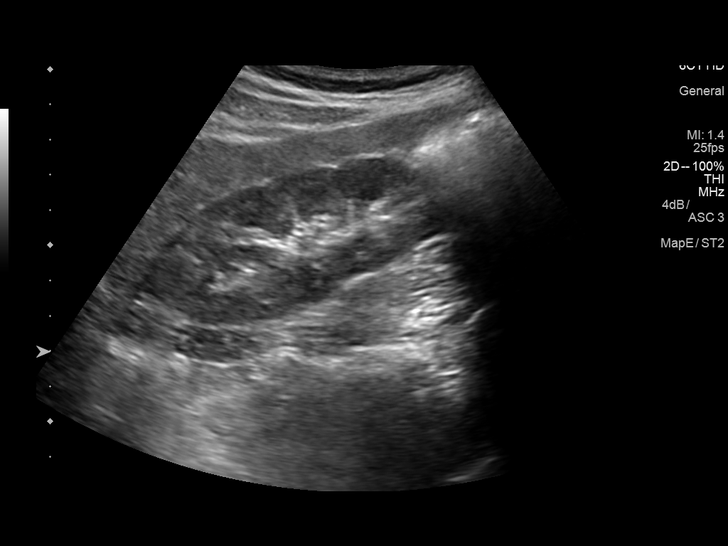
[im 63/84]
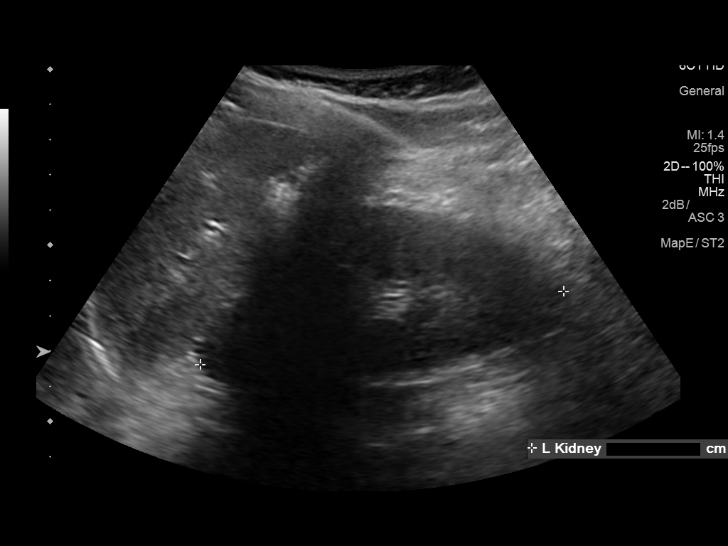
[im 70/84]
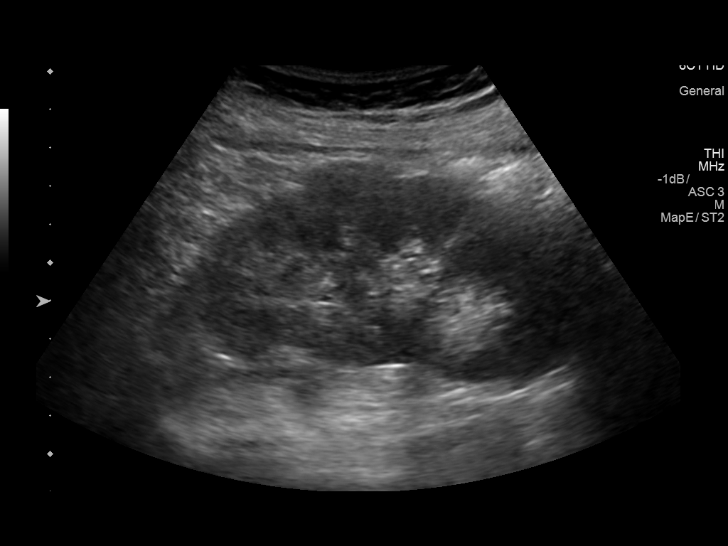
[im 77/84]
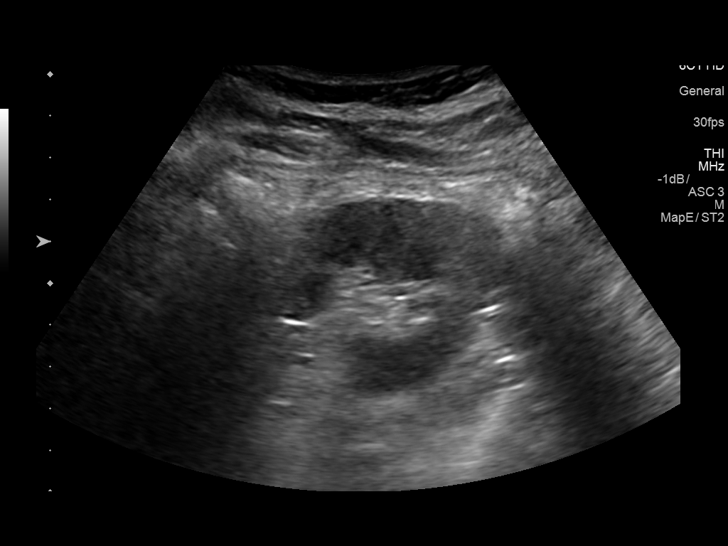
[im 84/84]
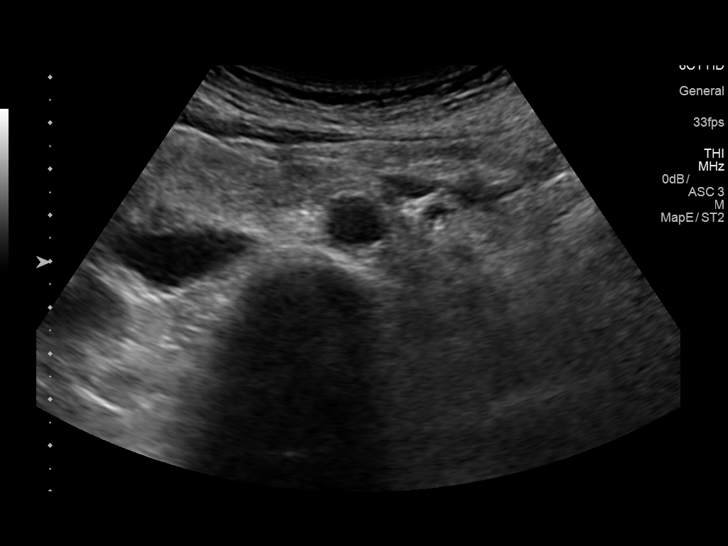

[14 of 25 positions shown; findings below may reference images not displayed]

FINDINGS: Gallbladder: 2 mm polyp. No gallstones or wall thickening
visualized. No sonographic Murphy sign noted by sonographer.

Common bile duct: Diameter: 1.8 mm

Liver: No focal lesion identified. Within normal limits in
parenchymal echogenicity. Portal vein is patent on color Doppler
imaging with normal direction of blood flow towards the liver.

IVC: No abnormality visualized.

Pancreas: Visualized portion unremarkable.

Spleen: Size and appearance within normal limits.

Right Kidney: Length: 10.6 cm. Echogenicity within normal limits. No
mass or hydronephrosis visualized.

Left Kidney: Length: 11.0 cm. Echogenicity within normal limits. No
mass or hydronephrosis visualized.

Abdominal aorta: No aneurysm visualized.

Other findings: None.
IMPRESSION: 1. 2 mm gallbladder polyp. No gallstones. No evidence cholecystitis
or biliary distention.

2.  Exam is otherwise normal.

## 2022-09-09 ENCOUNTER — Ambulatory Visit: Payer: 59 | Admitting: Family Medicine

## 2023-09-22 LAB — COMPREHENSIVE METABOLIC PANEL WITH GFR
Albumin: 4.7 (ref 3.5–5.0)
Calcium: 9.5 (ref 8.7–10.7)
Globulin: 2.3
eGFR: 112

## 2023-09-22 LAB — CBC: RBC: 4.83 (ref 3.87–5.11)

## 2023-09-22 LAB — HM PAP SMEAR: HM Pap smear: NEGATIVE

## 2023-09-22 LAB — BASIC METABOLIC PANEL WITH GFR
BUN: 14 (ref 4–21)
CO2: 23 — AB (ref 13–22)
Chloride: 101 (ref 99–108)
Creatinine: 0.7 (ref 0.5–1.1)
Glucose: 84
Potassium: 4.8 meq/L (ref 3.5–5.1)
Sodium: 138 (ref 137–147)

## 2023-09-22 LAB — HEPATIC FUNCTION PANEL
ALT: 13 U/L (ref 7–35)
AST: 18 (ref 13–35)
Alkaline Phosphatase: 61 (ref 25–125)
Bilirubin, Total: 0.4

## 2023-09-22 LAB — LIPID PANEL
Cholesterol: 135 (ref 0–200)
HDL: 84 — AB (ref 35–70)
LDL Cholesterol: 35
LDl/HDL Ratio: 0.4
Triglycerides: 88 (ref 40–160)

## 2023-09-22 LAB — CBC AND DIFFERENTIAL
HCT: 43 (ref 36–46)
Hemoglobin: 13.7 (ref 12.0–16.0)
Neutrophils Absolute: 4
Platelets: 345 K/uL (ref 150–400)
WBC: 7.2

## 2023-09-22 LAB — TSH: TSH: 1.42 (ref 0.41–5.90)

## 2023-09-22 LAB — VITAMIN D 25 HYDROXY (VIT D DEFICIENCY, FRACTURES): Vit D, 25-Hydroxy: 31.2

## 2023-09-22 LAB — HEMOGLOBIN A1C: Hemoglobin A1C: 5.5

## 2023-09-22 LAB — RESULTS CONSOLE HPV: CHL HPV: NEGATIVE

## 2023-11-17 ENCOUNTER — Ambulatory Visit (INDEPENDENT_AMBULATORY_CARE_PROVIDER_SITE_OTHER): Admitting: Student

## 2023-11-17 ENCOUNTER — Encounter: Payer: Self-pay | Admitting: Student

## 2023-11-17 VITALS — BP 107/73 | HR 71 | Temp 98.0°F | Ht 64.0 in | Wt 133.0 lb

## 2023-11-17 DIAGNOSIS — G43909 Migraine, unspecified, not intractable, without status migrainosus: Secondary | ICD-10-CM | POA: Diagnosis not present

## 2023-11-17 DIAGNOSIS — Z7689 Persons encountering health services in other specified circumstances: Secondary | ICD-10-CM

## 2023-11-17 MED ORDER — ONDANSETRON HCL 4 MG PO TABS: 4.0000 mg | ORAL_TABLET | Freq: Three times a day (TID) | ORAL | 0 refills | Status: AC | PRN

## 2023-11-17 MED ORDER — IBUPROFEN 600 MG PO TABS: 600.0000 mg | ORAL_TABLET | Freq: Four times a day (QID) | ORAL | 0 refills | Status: AC | PRN

## 2023-11-17 NOTE — Progress Notes (Signed)
 No chief complaint on file.      New Patient Visit SUBJECTIVE: HPI: Jessica Sloan is an 38 y.o.female who is being seen for establishing care.  The patient  has not had a prior PCP  History of Present Illness Jessica Sloan is a 38 year old female who presents to establish care and c/o headache. She is married with two sons, aged 41 and 9, and describes her life as busy and sometimes stressful. She seeks to establish care for regular preventative health maintenance. 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 Reports her roles are somewhat stressful, but does not thik this is contributing to Has.   Migraines have been occurring since July 2025, one to two times monthly, lasting about a day. They are severe, sometimes requiring bed rest, and are accompanied by nausea and vomiting, with occasional relief after vomiting. The migraines vary, presenting as a 'knife in the right eye' or as a tension headache across the head. No consistent trigger is identified, though a severe episode followed fish consumption from the [employer].  She uses over-the-counter migraine medication, taking two tablets during episodes, but finds it insufficient. She has not started the preventative medication prescribed by a neurologist due to dissatisfaction with the consultation.  Her medical history includes irregular menstrual cycles, now more regular but heavy for a day. She has undergone tubal ligation and laparoscopic removal of a migrated IUD. She has had LASIK surgery and now wears glasses regularly due to vision changes.  Family history includes migraines in her mother and colon cancer in her maternal grandmother. Her mother also has type 2 diabetes. .  Patient denies fever, chills, SOB, CP, palpitations, dyspnea, edema, vision changes, N/V/D, abdominal pain, urinary symptoms, rash, weight changes, and recent illness or hospitalizations.     Past Medical History:  Diagnosis Date    Hx of LASIK    Migraine    Past Surgical History:  Procedure Laterality Date   CESAREAN SECTION     IUD REMOVAL     TUBAL LIGATION     Family History  Problem Relation Age of Onset   Hypertension Mother    Diabetes type II Mother    Migraines Mother    Alcohol abuse Father    Hypertension Father    Cancer - Colon Maternal Grandmother    Macular degeneration Maternal Grandfather    Breast cancer Neg Hx    No Known Allergies  Current Outpatient Medications:    ibuprofen (ADVIL) 600 MG tablet, Take 1 tablet (600 mg total) by mouth every 6 (six) hours as needed., Disp: 30 tablet, Rfl: 0   ondansetron  (ZOFRAN ) 4 MG tablet, Take 1 tablet (4 mg total) by mouth every 8 (eight) hours as needed for nausea or vomiting., Disp: 20 tablet, Rfl: 0   acetaminophen (TYLENOL) 500 MG tablet, Take 1,000 mg by mouth every 6 (six) hours as needed for pain., Disp: , Rfl:    Vitamin D , Ergocalciferol , (DRISDOL ) 50000 units CAPS capsule, Take 1 capsule (50,000 Units total) by mouth every 7 (seven) days., Disp: 16 capsule, Rfl: 0  PHQ9 Today:    11/17/2023   10:20 AM 03/01/2017   10:27 AM  Depression screen PHQ 2/9  Decreased Interest 0 0  Down, Depressed, Hopeless 0 0  PHQ - 2 Score 0 0  Altered sleeping  1  Tired, decreased energy  1  Change in appetite  0  Feeling bad or failure about yourself   0  Trouble  concentrating  1  Moving slowly or fidgety/restless  0  Suicidal thoughts  0  PHQ-9 Score  3  Difficult doing work/chores  Somewhat difficult   GAD7 Today:     No data to display          OBJECTIVE: BP 107/73   Pulse 71   Temp 98 F (36.7 C)   Ht 5' 4 (1.626 m)   Wt 133 lb (60.3 kg)   LMP 11/07/2023 (Exact Date)   SpO2 99%   BMI 22.83 kg/m  General:  well developed, well nourished, in no apparent distress Skin:  no significant moles, warts, or growths Nose:  nares patent, septum midline, mucosa normal Throat/Pharynx:  lips and gingiva without lesion; tongue and  uvula midline; non-inflamed pharynx; no exudates or postnasal drainage Lungs:  clear to auscultation, breath sounds equal bilaterally, no respiratory distress Cardio:  regular rate and rhythm, no LE edema or bruits Musculoskeletal:  symmetrical muscle groups noted without atrophy or deformity Neuro:  gait normal Psych: well oriented with normal range of affect and appropriate judgment/insight  ASSESSMENT/PLAN: Encounter to establish care  Migraine without status migrainosus, not intractable, unspecified migraine type - Plan: ibuprofen (ADVIL) 600 MG tablet, ondansetron  (ZOFRAN ) 4 MG tablet  Patient instructed to sign release of records form from their previous PCP. The patient voiced understanding and agreement to the plan. Education provided today during visit and on AVS for patient to review at home.  Diet and Exercise recommendations provided.  Current diagnoses and recommendations discussed. HM recommendations reviewed with recommendations.    Migraine headaches with associated nausea Migraines occur 1-2 times monthly, severe with nausea/vomiting, lasting a day. Family history present. Differential includes hormonal, stress, vision, dietary triggers. MRI ordered by neurology to rule out structural causes. Prefers symptomatic management over daily prevention. - Prescribed ibuprofen 600 mg every 6 hours as needed for pain. - Prescribed Zofran  for nausea. - Advised maintaining a headache journal for trigger identification. - Advised adequate hydration and dietary modifications. - Encouraged follow-up with neurology  - Discussed hormonal regulation with OB/GYN if menstrual cycle related.  General Health Maintenance Establishing care for preventative health. Family history of colon cancer and type 2 diabetes noted. - Encouraged use of MyChart for appointments and health information. - Plan physical exam in three weeks  - Discussed importance of regular screenings, including mammograms  and colonoscopies, due to family history.  Patient should return 3-4 weeks Return in about 4 weeks (around 12/15/2023), or CPE.   Harlene LITTIE Jolly, DNP, AGNP-C 11/17/23  11:12 AM

## 2023-11-23 ENCOUNTER — Encounter: Admitting: Student

## 2023-11-25 NOTE — Progress Notes (Unsigned)
 Subjective:     Patient ID: Jessica Sloan, female    DOB: [birth year], 38 y.o.   MRN: [id number]  No chief complaint on file.   HPI  Discussed the use of AI scribe software for clinical note transcription with the patient, who gave verbal consent to proceed.  Migraines- Follows with neurology- [facility]- Dr.Freeman Plan for MRI- when?? How have migraines been?   Patient denies fever, chills, SOB, CP, palpitations, dyspnea, edema, HA, vision changes, N/V/D, abdominal pain, urinary symptoms, rash, weight changes, and recent illness or hospitalizations.   HCM Pap: Last 09/2023; Next due- 2030-Follows with GYN- [facility]- Dr. Nena Rivard     History of Present Illness              Health Maintenance Due  Topic Date Due   COVID-19 Vaccine (1) Never done   HIV Screening  Never done   Hepatitis C Screening  Never done   HPV VACCINES (1 - Risk 3-dose SCDM series) Never done   DTaP/Tdap/Td (2 - Td or Tdap) 02/02/2018   Mammogram  Never done   Influenza Vaccine  Never done    Past Medical History:  Diagnosis Date   Hx of LASIK    Migraine     Past Surgical History:  Procedure Laterality Date   CESAREAN SECTION     IUD REMOVAL     TUBAL LIGATION      Family History  Problem Relation Age of Onset   Hypertension Mother    Diabetes type II Mother    Migraines Mother    Alcohol abuse Father    Hypertension Father    Cancer - Colon Maternal Grandmother    Macular degeneration Maternal Grandfather    Breast cancer Neg Hx     Social History   Socioeconomic History   Marital status: Married    Spouse name: Not on file   Number of children: 2   Years of education: Not on file   Highest education level: Not on file  Occupational History   Not on file  Tobacco Use   Smoking status: Never   Smokeless tobacco: Never  Vaping Use   Vaping status: Never Used  Substance and Sexual Activity   Alcohol use: Not Currently     Comment: rarely, maybe twice a year   Drug use: No   Sexual activity: Yes    Birth control/protection: None  Other Topics Concern   Not on file  Social History Narrative   Not on file   Social Drivers of Health   Financial Resource Strain: Not on file  Food Insecurity: Not on file  Transportation Needs: Not on file  Physical Activity: Not on file  Stress: Not on file  Social Connections: Not on file  Intimate Partner Violence: Not on file    Outpatient Medications Prior to Visit  Medication Sig Dispense Refill   acetaminophen (TYLENOL) 500 MG tablet Take 1,000 mg by mouth every 6 (six) hours as needed for pain.     ibuprofen (ADVIL) 600 MG tablet Take 1 tablet (600 mg total) by mouth every 6 (six) hours as needed. 30 tablet 0   ondansetron  (ZOFRAN ) 4 MG tablet Take 1 tablet (4 mg total) by mouth every 8 (eight) hours as needed for nausea or vomiting. 20 tablet 0   Vitamin D , Ergocalciferol , (DRISDOL ) 50000 units CAPS capsule Take 1 capsule (50,000 Units total) by mouth every 7 (seven) days. 16 capsule 0  No facility-administered medications prior to visit.    No Known Allergies  ROS     Objective:    Physical Exam Vitals reviewed.  Constitutional:      General: She is not in acute distress.    Appearance: She is not ill-appearing, toxic-appearing or diaphoretic.  HENT:     Head: Normocephalic and atraumatic.     Right Ear: Tympanic membrane, ear canal and external ear normal.     Left Ear: Tympanic membrane, ear canal and external ear normal.     Nose: Nose normal. No congestion.     Mouth/Throat:     Mouth: Mucous membranes are moist.     Pharynx: Oropharynx is clear.  Eyes:     Extraocular Movements: Extraocular movements intact.     Right eye: Normal extraocular motion.     Left eye: Normal extraocular motion.     Conjunctiva/sclera: Conjunctivae normal.     Pupils: Pupils are equal, round, and reactive to light.  Neck:     Thyroid: No thyroid mass or  thyromegaly.     Vascular: No carotid bruit or JVD.  Cardiovascular:     Rate and Rhythm: Normal rate and regular rhythm.     Pulses: Normal pulses.          Radial pulses are 2+ on the right side and 2+ on the left side.       Dorsalis pedis pulses are 2+ on the right side and 2+ on the left side.     Heart sounds: Normal heart sounds, S1 normal and S2 normal. No murmur heard.    No friction rub. No gallop.  Pulmonary:     Effort: Pulmonary effort is normal. No respiratory distress.     Breath sounds: Normal breath sounds. No wheezing.  Abdominal:     General: Bowel sounds are normal. There is no distension.     Palpations: Abdomen is soft.     Tenderness: There is no abdominal tenderness. There is no guarding.  Musculoskeletal:        General: No swelling. Normal range of motion.     Cervical back: Full passive range of motion without pain, normal range of motion and neck supple. No edema or erythema.     Right lower leg: No edema.     Left lower leg: No edema.  Lymphadenopathy:     Cervical: No cervical adenopathy.  Skin:    General: Skin is warm and dry.     Capillary Refill: Capillary refill takes less than 2 seconds.  Neurological:     General: No focal deficit present.     Mental Status: She is alert and oriented to person, place, and time.     Cranial Nerves: No cranial nerve deficit.     Motor: No weakness.     Coordination: Coordination normal.     Gait: Gait normal.     Deep Tendon Reflexes: Reflexes normal.  Psychiatric:        Mood and Affect: Mood normal.        Behavior: Behavior normal.        Thought Content: Thought content normal.        Judgment: Judgment normal.      LMP 11/07/2023 (Exact Date)  Wt Readings from Last 3 Encounters:  11/17/23 133 lb (60.3 kg)  04/12/17 127 lb 3.2 oz (57.7 kg)  03/01/17 129 lb 3.2 oz (58.6 kg)       Assessment & Plan:   Problem List  Items Addressed This Visit   None   I am having Jessica Sloan maintain her  acetaminophen, Vitamin D  (Ergocalciferol ), ibuprofen, and ondansetron .  No orders of the defined types were placed in this encounter.

## 2023-12-07 ENCOUNTER — Ambulatory Visit: Admitting: Student

## 2023-12-07 ENCOUNTER — Encounter: Payer: Self-pay | Admitting: Student

## 2023-12-07 VITALS — BP 107/66 | HR 79 | Ht 64.0 in | Wt 136.2 lb

## 2023-12-07 DIAGNOSIS — E559 Vitamin D deficiency, unspecified: Secondary | ICD-10-CM | POA: Diagnosis not present

## 2023-12-07 DIAGNOSIS — Z0000 Encounter for general adult medical examination without abnormal findings: Secondary | ICD-10-CM | POA: Diagnosis not present

## 2023-12-07 DIAGNOSIS — Z1231 Encounter for screening mammogram for malignant neoplasm of breast: Secondary | ICD-10-CM | POA: Diagnosis not present

## 2023-12-07 DIAGNOSIS — G43909 Migraine, unspecified, not intractable, without status migrainosus: Secondary | ICD-10-CM | POA: Diagnosis not present

## 2023-12-07 NOTE — Assessment & Plan Note (Addendum)
 Taking 1000 mg over-the-counter daily.  Supplement and monitor.

## 2023-12-07 NOTE — Assessment & Plan Note (Signed)
Patient encouraged to maintain heart healthy diet, regular exercise, adequate sleep. Consider daily probiotics 

## 2023-12-07 NOTE — Assessment & Plan Note (Addendum)
 Intermittent migraines, <1-2 times per month, managed with ibuprofen and lifestyle changes.  - Continue ibuprofen as needed for migraine relief. - Continue lifestyle modifications to identify and avoid triggers.

## 2024-01-20 ENCOUNTER — Telehealth: Admitting: Physician Assistant

## 2024-01-20 DIAGNOSIS — K648 Other hemorrhoids: Secondary | ICD-10-CM

## 2024-01-20 MED ORDER — HYDROCORTISONE ACETATE 25 MG RE SUPP: 25.0000 mg | Freq: Two times a day (BID) | RECTAL | 0 refills | Status: AC

## 2024-01-20 NOTE — Patient Instructions (Signed)
 Jessica Sloan, thank you for joining Jessica Velma Lunger, PA-C for today's virtual visit.  While this provider is not your primary care provider (PCP), if your PCP is located in our provider database this encounter information will be shared with them immediately following your visit.   A [name] account gives you access to today's visit and all your visits, tests, and labs performed at [facility]  click here if you don't have a [name] account or go to [email or web address]  Consent: (Patient) Jessica Sloan provided verbal consent for this virtual visit at the beginning of the encounter.  Current Medications:  Current Outpatient Medications:    acetaminophen (TYLENOL) 500 MG tablet, Take 1,000 mg by mouth every 6 (six) hours as needed for pain., Disp: , Rfl:    ibuprofen  (ADVIL ) 600 MG tablet, Take 1 tablet (600 mg total) by mouth every 6 (six) hours as needed., Disp: 30 tablet, Rfl: 0   ondansetron  (ZOFRAN ) 4 MG tablet, Take 1 tablet (4 mg total) by mouth every 8 (eight) hours as needed for nausea or vomiting., Disp: 20 tablet, Rfl: 0   Vitamin D , Ergocalciferol , (DRISDOL ) 50000 units CAPS capsule, Take 1 capsule (50,000 Units total) by mouth every 7 (seven) days., Disp: 16 capsule, Rfl: 0   Medications ordered in this encounter:  No orders of the defined types were placed in this encounter.    *If you need refills on other medications prior to your next appointment, please contact your pharmacy*  Follow-Up: Call back or seek an in-person evaluation if the symptoms worsen or if the condition fails to improve as anticipated.  [facility] [phone]  Other Instructions   We are sorry that you are not feeling well. We are here to help!  Hemorrhoids are swollen veins in the rectum. They can cause itching, bleeding, and pain. Hemorrhoids are very common.  In some cases, you can see or feel hemorrhoids around the outside of the  rectum. In other cases, you cannot see them because they are hidden inside the rectum. Be patient - It can take months for this to improve or go away.   Hemorrhoids do not always cause symptoms. But when they do, symptoms can include: ?Itching of the skin around the anus ?Bleeding - Bleeding is usually painless. You might see bright red blood after using the toilet. ?Pain - If a blood clot forms inside a hemorrhoid, this can cause pain. It can also cause a lump that you might be able to feel.   What can I do to keep from getting more hemorrhoids? -- The most important thing you can do is to keep from getting constipated. You should have a bowel movement at least a few times a week. When you have a bowel movement, you also should not have to push too much. Plus, your bowel movements should not be too hard. Being constipated and having hard bowel movements can make hemorrhoids worse.   I have prescribed Anusol  HC suppositories.  Insert into rectum twice per day for 6 days  HOME CARE: Sitz Baths twice daily. Soak buttocks in 2 or 3 inches of warm water for 10 to 15 minutes. Do not add soap, bubble bath, or anything to the water. Stool softener such as Colace 100 mg twice daily AND Miralax 1 scoop daily until you have regular soft stools Over the counter Preparation H Tucks Pads Witch Hazel  Here are some steps you can take to avoid getting constipated  or having hard stools:  ?Eat lots of fruits, vegetables, and other foods with fiber. Fiber helps to increase bowel movements. If you do not get enough fiber from your diet, you can take fiber supplements. These come in the form of powders, wafers, or pills. Some examples are Metamucil, Citrucel, Benefiber and FiberCon. If you take a fiber supplement, be sure to read the label so you know how much to take. If you're not sure, ask your provider or nurse. ?Take medicines called stool softeners such as docusate sodium (sample brand names: Colace,  Dulcolax). These medicines increase the number of bowel movements you have. They are safe to take and they can prevent problems later.  You should request a referral for a follow up evaluation with a Gastroenterologist (GI doctor) to evaluate this chronic and relapsing condition - even if it improves to see what further steps need to be taken. This is highly linked to chronic constipation and straining to have a bowel movement. It may require further treatment or surgical intervention.   GET HELP RIGHT AWAY IF: You develop severe pain You have heavy bleeding   FOLLOW UP WITH YOUR PRIMARY PROVIDER IF: If your symptoms do not improve within 10 days  MAKE SURE YOU  Understand these instructions. Will watch your condition. Will get help right away if you are not doing well or get worse.    Jessica Velma Lunger, PA-C       If you have been instructed to have an in-person evaluation today at a local Urgent Care facility, please use the link below. It will take you to a list of all of our available [facility], including address, phone number and hours of operation. Please do not delay care.  [facility]  If you or a family member do not have a primary care provider, use the link below to schedule a visit and establish care. When you choose a [facility] primary care physician or advanced practice provider, you gain a long-term partner in health. Find a Primary Care Provider  Learn more about [facility] in-office and virtual care options: [facility] Get Care Now

## 2024-01-20 NOTE — Progress Notes (Signed)
 Virtual Visit Consent   Jessica Sloan, you are scheduled for a virtual visit with a [facility] provider today. Just as with appointments in the office, your consent must be obtained to participate. Your consent will be active for this visit and any virtual visit you may have with one of our providers in the next 365 days. If you have a MyChart account, a copy of this consent can be sent to you electronically.  As this is a virtual visit, video technology does not allow for your provider to perform a traditional examination. This may limit your provider's ability to fully assess your condition. If your provider identifies any concerns that need to be evaluated in person or the need to arrange testing (such as labs, EKG, etc.), we will make arrangements to do so. Although advances in technology are sophisticated, we cannot ensure that it will always work on either your end or our end. If the connection with a video visit is poor, the visit may have to be switched to a telephone visit. With either a video or telephone visit, we are not always able to ensure that we have a secure connection.  By engaging in this virtual visit, you consent to the provision of healthcare and authorize for your insurance to be billed (if applicable) for the services provided during this visit. Depending on your insurance coverage, you may receive a charge related to this service.  I need to obtain your verbal consent now. Are you willing to proceed with your visit today? Jessica Sloan has provided verbal consent on 01/20/2024 for a virtual visit (video or telephone). Jessica Sloan, [place]  Date: 01/20/2024 8:26 AM   Virtual Visit via Video Note   I, Jessica Sloan, connected with  Jessica Sloan  ([id number], 02-06-1985) on 01/20/2024 at  8:00 AM EST by a video-enabled telemedicine application and verified that I am speaking with the correct person using two identifiers.  Location: Patient: Virtual Visit Location  Patient: Home Provider: Virtual Visit Location Provider: Home Office   I discussed the limitations of evaluation and management by telemedicine and the availability of in person appointments. The patient expressed understanding and agreed to proceed.    History of Present Illness: Jessica Sloan is a 38 y.o. who identifies as a female who was assigned female at birth, and is being seen today for concern for possible hemorrhoid. Notes symptoms starting about a week ago with fullness in the anal area with some irritation and itching with bowel movements. Initially with episode of BRBPR -- only once. Yesterday with some increased pain. Denies anything palpable on the outside. Denies hard stool. History of hemorrhoids with pregnancies but otherwise not had an issue.SABRA   HPI: HPI  Problems:  Patient Active Problem List   Diagnosis Date Noted   Annual visit for general adult medical examination without abnormal findings 12/07/2023   Encounter for screening mammogram for malignant neoplasm of breast 12/07/2023   Vitamin D  deficiency 12/07/2023   Migraine without status migrainosus, not intractable 12/07/2023   Right upper quadrant abdominal pain 03/01/2017   Health care maintenance 04/28/2016   Pressure sensation in ear, right 04/28/2016    Allergies: Allergies[1] Medications: Current Medications[2]  Observations/Objective: Patient is well-developed, well-nourished in no acute distress.  Resting comfortably at home.  Head is normocephalic, atraumatic.  No labored breathing.  Speech is clear and coherent with logical content.  Patient is alert and oriented at baseline.   Assessment and Plan: 1. Internal hemorrhoid (Primary) -  hydrocortisone  (ANUSOL -HC) 25 MG suppository; Place 1 suppository (25 mg total) rectally 2 (two) times daily.  Dispense: 12 suppository; Refill: 0  Suspected internal hemorrhoid. Discussed cannot formally diagnose without examination. No alarm signs or symptoms  present. Will start Anusol -HC supp for 6 days. In-person evaluation for any non-resolving, new or worsening symptoms despite treatment.  Follow Up Instructions: I discussed the assessment and treatment plan with the patient. The patient was provided an opportunity to ask questions and all were answered. The patient agreed with the plan and demonstrated an understanding of the instructions.  A copy of instructions were sent to the patient via MyChart unless otherwise noted below.   The patient was advised to call back or seek an in-person evaluation if the symptoms worsen or if the condition fails to improve as anticipated.    Jessica Velma Lunger, PA-C    [1] No Known Allergies [2]  Current Outpatient Medications:    hydrocortisone  (ANUSOL -HC) 25 MG suppository, Place 1 suppository (25 mg total) rectally 2 (two) times daily., Disp: 12 suppository, Rfl: 0   acetaminophen (TYLENOL) 500 MG tablet, Take 1,000 mg by mouth every 6 (six) hours as needed for pain., Disp: , Rfl:    ibuprofen  (ADVIL ) 600 MG tablet, Take 1 tablet (600 mg total) by mouth every 6 (six) hours as needed., Disp: 30 tablet, Rfl: 0   ondansetron  (ZOFRAN ) 4 MG tablet, Take 1 tablet (4 mg total) by mouth every 8 (eight) hours as needed for nausea or vomiting., Disp: 20 tablet, Rfl: 0   Vitamin D , Ergocalciferol , (DRISDOL ) 50000 units CAPS capsule, Take 1 capsule (50,000 Units total) by mouth every 7 (seven) days., Disp: 16 capsule, Rfl: 0
